# Patient Record
Sex: Female | Born: 1960 | Race: White | Hispanic: No | Marital: Married | Smoking: Never smoker
Health system: Southern US, Community
[De-identification: ages and names within clinical notes are randomized; demographics above are authoritative.]

## PROBLEM LIST (undated history)

## (undated) DIAGNOSIS — T7840XA Allergy, unspecified, initial encounter: Secondary | ICD-10-CM

## (undated) HISTORY — PX: ABDOMINAL HYSTERECTOMY: SHX81

## (undated) HISTORY — DX: Allergy, unspecified, initial encounter: T78.40XA

---

## 2000-08-01 ENCOUNTER — Encounter: Payer: Self-pay | Admitting: Internal Medicine

## 2000-08-01 ENCOUNTER — Encounter: Admission: RE | Admit: 2000-08-01 | Discharge: 2000-08-01 | Payer: Self-pay | Admitting: Internal Medicine

## 2011-09-13 ENCOUNTER — Other Ambulatory Visit: Payer: Self-pay | Admitting: Obstetrics and Gynecology

## 2011-09-13 DIAGNOSIS — N6310 Unspecified lump in the right breast, unspecified quadrant: Secondary | ICD-10-CM

## 2011-09-20 ENCOUNTER — Ambulatory Visit
Admission: RE | Admit: 2011-09-20 | Discharge: 2011-09-20 | Disposition: A | Discharge status: Home / Self Care | Payer: BC Managed Care – PPO | Source: Ambulatory Visit | Attending: Obstetrics and Gynecology | Admitting: Obstetrics and Gynecology

## 2011-09-20 ENCOUNTER — Other Ambulatory Visit: Payer: Self-pay | Admitting: Obstetrics and Gynecology

## 2011-09-20 DIAGNOSIS — N6310 Unspecified lump in the right breast, unspecified quadrant: Secondary | ICD-10-CM

## 2012-08-29 ENCOUNTER — Ambulatory Visit (INDEPENDENT_AMBULATORY_CARE_PROVIDER_SITE_OTHER): Payer: BC Managed Care – PPO | Admitting: Family Medicine

## 2012-08-29 VITALS — BP 118/74 | HR 72 | Temp 98.5°F | Resp 16 | Ht 67.5 in | Wt 147.0 lb

## 2012-08-29 DIAGNOSIS — R002 Palpitations: Secondary | ICD-10-CM

## 2012-08-29 NOTE — Progress Notes (Signed)
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   Associated with a low pulse oximetry reading, fatigue, some ankle swelling  F/H: positive for A.fib  PMHx:  H/o one episode of a.fib 3 years ago  Objective: NAD HEENT:  Unremarkable Neck: supple, no bruit, no adenopathy Chest:  Clear Heart:  Regular, no murmur or gallop Abdomen:  Soft, nontender, no HSM or mass Extrem: no edema, good pedal pulses   EKG:  occas PVC  Assessment: Palpitations which may be related to these PVCs. Seeing no immediate cause for concern.  plan: Cardiology consult  Palpitations - Plan: EKG 12-Lead, Ambulatory referral to Cardiology  Signed, Elvina Sidle, MD

## 2012-10-30 ENCOUNTER — Institutional Professional Consult (permissible substitution): Payer: BC Managed Care – PPO | Admitting: Cardiovascular Disease

## 2013-07-07 IMAGING — US US BREAST*R*
1 series · 4 of 4 positions shown · non-contrast
Comparison: 02/04/2007 from [REDACTED].

CLINICAL DATA: The patient feels a pea-sized lump at 12 o'clock 3
cm from the right nipple.

DIGITAL DIAGNOSTIC BILATERAL MAMMOGRAM WITH CAD AND RIGHT BREAST
ULTRASOUND:

[Series 1: us breast*right* · 4 of 4 slices shown]
[im 1/4]
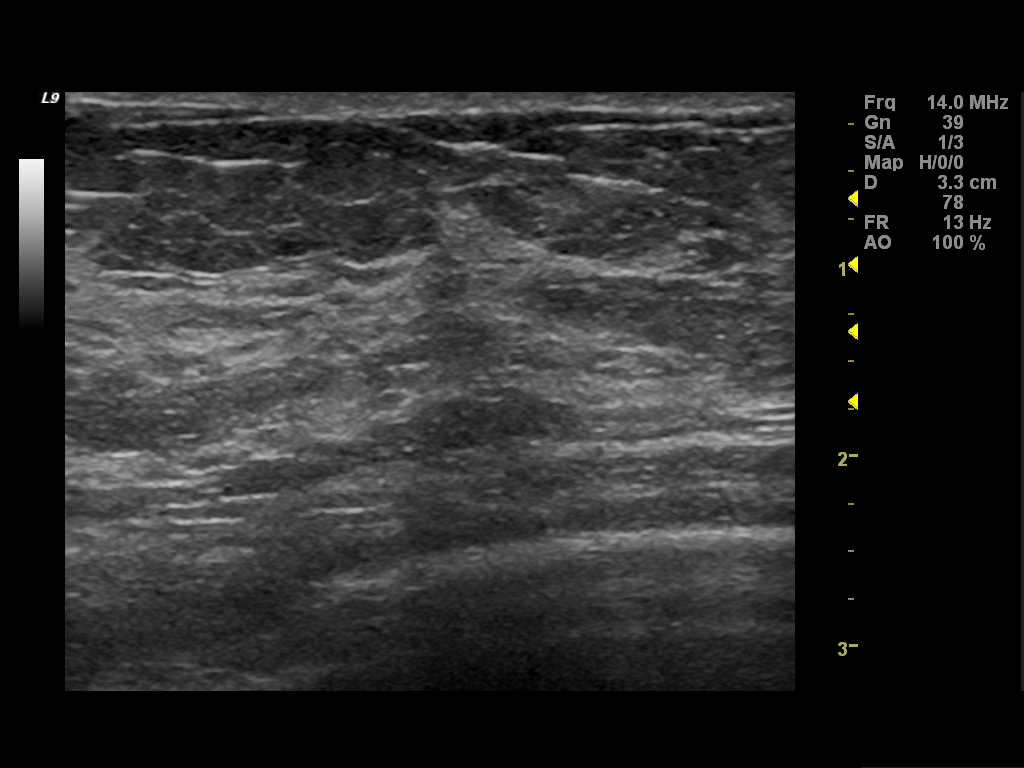
[im 2/4]
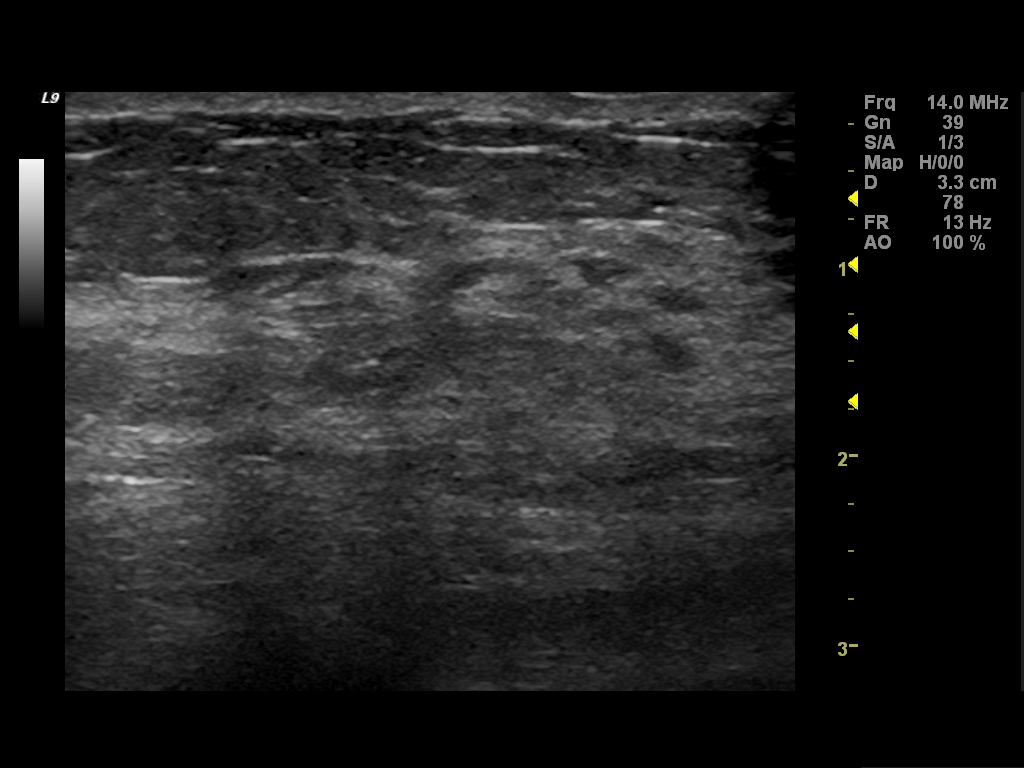
[im 3/4]
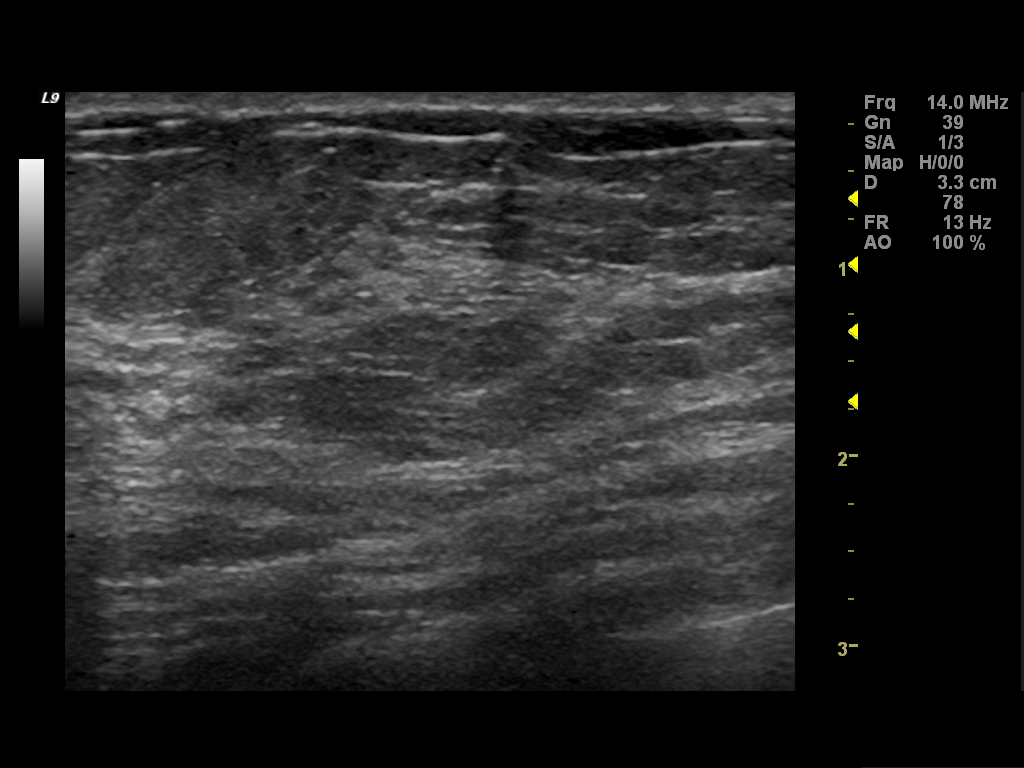
[im 4/4]
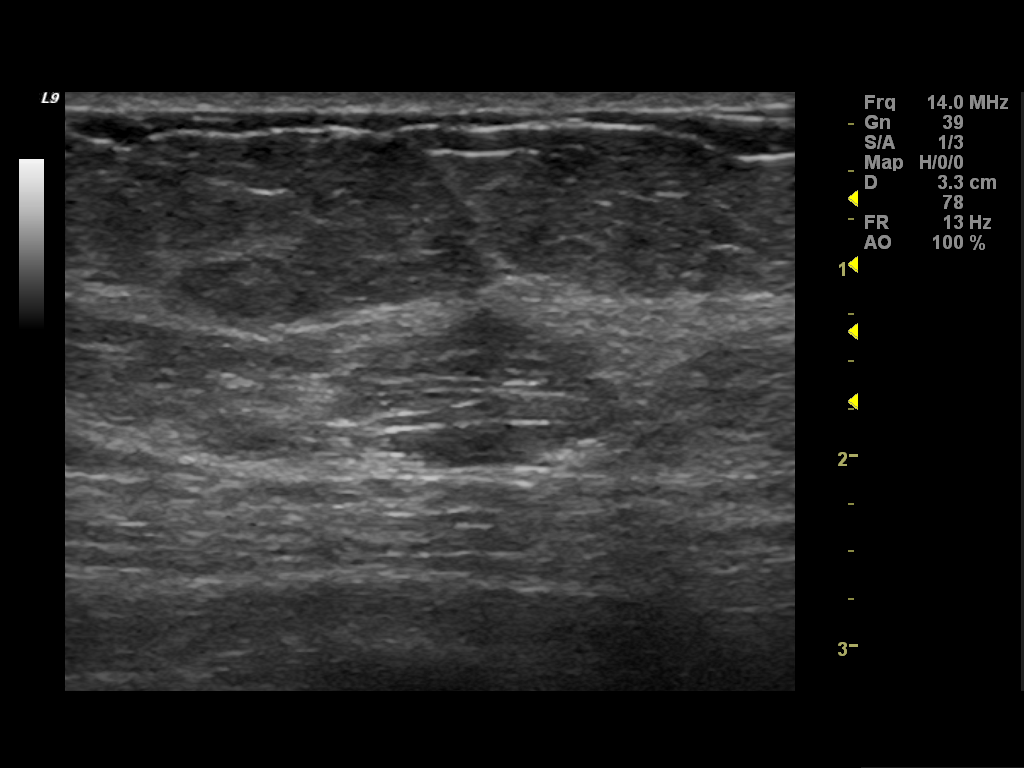

[4 of 4 positions shown; findings below may reference images not displayed]

FINDINGS: The breast tissue is heterogeneously dense.  Parenchymal
pattern is stable as compared to prior study with no mass,
distortion or shadowing to suggest malignancy.  A spot tangential
view of the area of concern in the upper portion of the right
breast demonstrates underlying fat and fibroglandular tissue.
Mammographic images were processed with CAD.

On physical exam, no mass is palpated in the 12 o'clock position of
the right breast.  The patient points to the area of concern as
being at 12 o'clock 3 cm from the right nipple.

Ultrasound is performed, showing mixed fibroglandular tissue and
fat throughout the upper half of the right breast with no
suspicious mass, distortion or shadowing to suggest malignancy.
IMPRESSION: No mammographic or sonographic evidence of malignancy.  The patient
was counseled to continue physical exam and to consult her
physician should she notice any changes.

RECOMMENDATION:
Yearly screening mammography is suggested.

BI-RADS CATEGORY 1:  Negative.

## 2014-01-20 ENCOUNTER — Ambulatory Visit (INDEPENDENT_AMBULATORY_CARE_PROVIDER_SITE_OTHER): Payer: 59 | Admitting: Family Medicine

## 2014-01-20 VITALS — BP 122/72 | HR 61 | Temp 98.0°F | Resp 16 | Ht 68.0 in | Wt 159.0 lb

## 2014-01-20 DIAGNOSIS — R059 Cough, unspecified: Secondary | ICD-10-CM

## 2014-01-20 DIAGNOSIS — J209 Acute bronchitis, unspecified: Secondary | ICD-10-CM

## 2014-01-20 DIAGNOSIS — H9202 Otalgia, left ear: Secondary | ICD-10-CM

## 2014-01-20 DIAGNOSIS — R062 Wheezing: Secondary | ICD-10-CM

## 2014-01-20 DIAGNOSIS — J0100 Acute maxillary sinusitis, unspecified: Secondary | ICD-10-CM

## 2014-01-20 DIAGNOSIS — R05 Cough: Secondary | ICD-10-CM

## 2014-01-20 MED ORDER — AZITHROMYCIN 250 MG PO TABS
ORAL_TABLET | ORAL | Status: AC
Start: 2014-01-20 — End: ?

## 2014-01-20 MED ORDER — METHYLPREDNISOLONE (PAK) 4 MG PO TABS: ORAL_TABLET | ORAL | Status: AC

## 2014-01-20 MED ORDER — FLUTICASONE PROPIONATE 50 MCG/ACT NA SUSP: 2.0000 | Freq: Every day | NASAL | Status: AC

## 2014-01-20 NOTE — Progress Notes (Signed)
Chief Complaint:  Chief Complaint  Patient presents with  . Cough    HPI: Natalie Khan is a 53 y.o. female who is here for 1 week hx of , she is a Engineer, civil (consulting)nurse, she was hiding it She works at [employer],  Has tried ginger tea  She has wheezing in her trachea area. Ear pain and head hurts. PND and she has not had anuthing. Non[rpductive. No fevers or chills,  Has not had the flu vaccine,  NO CP or SOB, n/v/abd pain, rashes.diarrhea  Past Medical History  Diagnosis Date  . Allergy    Past Surgical History  Procedure Laterality Date  . Abdominal hysterectomy     History   Social History  . Marital Status: Married    Spouse Name: N/A    Number of Children: N/A  . Years of Education: N/A   Social History Main Topics  . Smoking status: Never Smoker   . Smokeless tobacco: None  . Alcohol Use: 0.5 oz/week    1 drink(s) per week  . Drug Use: No  . Sexual Activity: None   Other Topics Concern  . None   Social History Narrative   Family History  Problem Relation Age of Onset  . Heart disease Mother   . Hyperlipidemia Mother   . Hypertension Mother   . Heart disease Father   . Stroke Father   . Hypertension Father   . Hyperlipidemia Father   . Heart disease Brother   . Hyperlipidemia Brother   . Hypertension Brother    No Known Allergies Prior to Admission medications   Medication Sig Start Date End Date Taking? Authorizing Provider  sertraline (ZOLOFT) 100 MG tablet Take 100 mg by mouth daily.   Yes Historical Provider, MD     ROS: The patient denies fevers, chills, night sweats, unintentional weight loss, chest pain, palpitations,  dyspnea on exertion, nausea, vomiting, abdominal pain, dysuria, hematuria, melena, numbness, weakness, or tingling.  All other systems have been reviewed and were otherwise negative with the exception of those mentioned in the HPI and as above.    PHYSICAL EXAM: Filed Vitals:   01/20/14 0934  BP: 122/72  Pulse: 61  Temp: 98 F (36.7  C)  Resp: 16   Filed Vitals:   01/20/14 0934  Height: 5\' 8"  (1.727 m)  Weight: 159 lb (72.122 kg)   Body mass index is 24.18 kg/(m^2).  General: Alert, no acute distress HEENT:  Normocephalic, atraumatic, oropharynx patent. EOMI, PERRLA TM nl, erythematous throat, No exudates. + boggy nares, + sinus tenderness. Cardiovascular:  Regular rate and rhythm, no rubs murmurs or gallops.  No Carotid bruits, radial pulse intact. No pedal edema.  Respiratory: Clear to auscultation bilaterally.  + wheezes ( minimal) , No rales, or rhonchi.  No cyanosis, no use of accessory musculature GI: No organomegaly, abdomen is soft and non-tender, positive bowel sounds.  No masses. Skin: No rashes. Neurologic: Facial musculature symmetric. Psychiatric: Patient is appropriate throughout our interaction. Lymphatic: No cervical lymphadenopathy Musculoskeletal: Gait intact.   LABS: No results found for this or any previous visit.   EKG/XRAY:   [facility]   ASSESSMENT/PLAN: Encounter Diagnoses  Name Primary?  . Acute maxillary sinusitis, recurrence not specified Yes  . Otalgia of left ear   . Acute bronchitis, unspecified organism   . Cough   . Wheezing    Rx Azithromycin Rx Flonase  Rx Medrol dose pack, has albuterol  inh prn F/u prn   Gross sideeffects, risk and benefits, and alternatives of medications d/w patient. Patient is aware that all medications have potential sideeffects and we are unable to predict every sideeffect or drug-drug interaction that may occur.  Jannae Fagerstrom PHUONG, DO 01/20/2014 10:28 AM

## 2014-01-20 NOTE — Patient Instructions (Signed)
Acute Bronchitis °Bronchitis is inflammation of the airways that extend from the windpipe into the lungs (bronchi). The inflammation often causes mucus to develop. This leads to a cough, which is the most common symptom of bronchitis.  °In acute bronchitis, the condition usually develops suddenly and goes away over time, usually in a couple weeks. Smoking, allergies, and asthma can make bronchitis worse. Repeated episodes of bronchitis may cause further lung problems.  °CAUSES °Acute bronchitis is most often caused by the same virus that causes a cold. The virus can spread from person to person (contagious) through coughing, sneezing, and touching contaminated objects. °SIGNS AND SYMPTOMS  °· Cough.   °· Fever.   °· Coughing up mucus.   °· Body aches.   °· Chest congestion.   °· Chills.   °· Shortness of breath.   °· Sore throat.   °DIAGNOSIS  °Acute bronchitis is usually diagnosed through a physical exam. Your health care provider will also ask you questions about your medical history. Tests, such as chest X-rays, are sometimes done to rule out other conditions.  °TREATMENT  °Acute bronchitis usually goes away in a couple weeks. Oftentimes, no medical treatment is necessary. Medicines are sometimes given for relief of fever or cough. Antibiotic medicines are usually not needed but may be prescribed in certain situations. In some cases, an inhaler may be recommended to help reduce shortness of breath and control the cough. A cool mist vaporizer may also be used to help thin bronchial secretions and make it easier to clear the chest.  °HOME CARE INSTRUCTIONS °· Get plenty of rest.   °· Drink enough fluids to keep your urine clear or pale yellow (unless you have a medical condition that requires fluid restriction). Increasing fluids may help thin your respiratory secretions (sputum) and reduce chest congestion, and it will prevent dehydration.   °· Take medicines only as directed by your health care provider. °· If  you were prescribed an antibiotic medicine, finish it all even if you start to feel better. °· Avoid smoking and secondhand smoke. Exposure to cigarette smoke or irritating chemicals will make bronchitis worse. If you are a smoker, consider using nicotine gum or skin patches to help control withdrawal symptoms. Quitting smoking will help your lungs heal faster.   °· Reduce the chances of another bout of acute bronchitis by washing your hands frequently, avoiding people with cold symptoms, and trying not to touch your hands to your mouth, nose, or eyes.   °· Keep all follow-up visits as directed by your health care provider.   °SEEK MEDICAL CARE IF: °Your symptoms do not improve after 1 week of treatment.  °SEEK IMMEDIATE MEDICAL CARE IF: °· You develop an increased fever or chills.   °· You have chest pain.   °· You have severe shortness of breath. °· You have bloody sputum.   °· You develop dehydration. °· You faint or repeatedly feel like you are going to pass out. °· You develop repeated vomiting. °· You develop a severe headache. °MAKE SURE YOU:  °· Understand these instructions. °· Will watch your condition. °· Will get help right away if you are not doing well or get worse. °Document Released: 03/18/2004 Document Revised: 06/25/2013 Document Reviewed: 08/01/2012 °[employer]® Patient Information ©2015 [employer]. This information is not intended to replace advice given to you by your health care provider. Make sure you discuss any questions you have with your health care provider. ° °

## 2019-05-03 ENCOUNTER — Ambulatory Visit: Payer: Self-pay | Attending: Internal Medicine

## 2019-05-03 DIAGNOSIS — Z23 Encounter for immunization: Secondary | ICD-10-CM

## 2019-05-03 NOTE — Progress Notes (Signed)
   Covid-19 Vaccination Clinic  Name:  Natalie Khan    MRN: [id number] DOB: [birth year]  05/03/2019  [name] was observed post Covid-19 immunization for 15 minutes without incident. She was provided with Vaccine Information Sheet and instruction to access the V-Safe system.   [name] was instructed to call 911 with any severe reactions post vaccine: Marland Kitchen Difficulty breathing  . Swelling of face and throat  . A fast heartbeat  . A bad rash all over body  . Dizziness and weakness   Immunizations Administered    Name Date Dose VIS Date Route   Pfizer COVID-19 Vaccine 05/03/2019  8:14 AM 0.3 mL 02/02/2019 Intramuscular   Manufacturer: [employer], [employer]   Lot: [id number]   NDC: [address]-6

## 2019-05-28 ENCOUNTER — Ambulatory Visit: Payer: Self-pay | Attending: Internal Medicine

## 2019-05-28 DIAGNOSIS — Z23 Encounter for immunization: Secondary | ICD-10-CM

## 2019-05-28 NOTE — Progress Notes (Signed)
   Covid-19 Vaccination Clinic  Name:  Natalie Khan    MRN: [id number] DOB: October 22, 1960  05/28/2019  [name] was observed post Covid-19 immunization for 15 minutes without incident. She was provided with Vaccine Information Sheet and instruction to access the V-Safe system.   [name] was instructed to call 911 with any severe reactions post vaccine: Marland Kitchen Difficulty breathing  . Swelling of face and throat  . A fast heartbeat  . A bad rash all over body  . Dizziness and weakness   Immunizations Administered    Name Date Dose VIS Date Route   Pfizer COVID-19 Vaccine 05/28/2019  8:37 AM 0.3 mL 02/02/2019 Intramuscular   Manufacturer: [employer], [employer]   Lot: [id number]   NDC: [address]-1
